# Patient Record
Sex: Female | Born: 1995 | Race: White | Hispanic: No | Marital: Single | Smoking: Never smoker
Health system: Southern US, Community
[De-identification: ages and names within clinical notes are randomized; demographics above are authoritative.]

## PROBLEM LIST (undated history)

## (undated) HISTORY — PX: HAND SURGERY: SHX662

## (undated) HISTORY — PX: WISDOM TOOTH EXTRACTION: SHX21

---

## 2019-10-19 ENCOUNTER — Encounter: Payer: Self-pay | Admitting: Physician Assistant

## 2019-10-19 ENCOUNTER — Ambulatory Visit: Payer: Self-pay | Admitting: Physician Assistant

## 2019-10-19 ENCOUNTER — Other Ambulatory Visit: Payer: Self-pay

## 2019-10-19 VITALS — BP 118/70 | HR 60 | Temp 98.4°F | Resp 12 | Ht 69.0 in | Wt 136.0 lb

## 2019-10-19 DIAGNOSIS — Z0000 Encounter for general adult medical examination without abnormal findings: Secondary | ICD-10-CM

## 2019-10-19 DIAGNOSIS — Z021 Encounter for pre-employment examination: Secondary | ICD-10-CM

## 2019-10-19 DIAGNOSIS — R82998 Other abnormal findings in urine: Secondary | ICD-10-CM

## 2019-10-19 LAB — POCT URINALYSIS DIPSTICK
Bilirubin, UA: NEGATIVE
Blood, UA: NEGATIVE
Glucose, UA: NEGATIVE
Ketones, UA: NEGATIVE
Nitrite, UA: NEGATIVE
Protein, UA: NEGATIVE
Spec Grav, UA: >=1.03 — AB (ref 1.010–1.025)
Urobilinogen, UA: 0.2 E.U./dL
pH, UA: 6 (ref 5.0–8.0)

## 2019-10-19 NOTE — Progress Notes (Signed)
   Subjective: Employment physical    Patient ID: Catherine Sandoval, female    DOB: 1996-01-14, 24 y.o.   MRN: [id number]  HPI Patient presents for employment physical for [employer].  Patient voices no concerns or complaints.   Review of Systems Negative    Objective:   Physical Exam No acute distress.  HEENT is unremarkable.  Neck is supple for adenopathy or bruits.  Lungs are clear to auscultation.  Heart regular rate and rhythm.  Abdomen with negative HSM, normoactive bowel sounds, soft nontender palpation.  No obvious deformity to the upper or lower extremities.  Patient is full equal range of motion upper and lower extremities.  No obvious deformities of the cervical lumbar spine.  Patient has full equal range of motion cervical lumbar spine.  Cranial nerves II through XII grossly intact.       Assessment & Plan: Well exam.  Urine showed 2+ leukocytes but patient is asymptomatic.  Culture is pending.

## 2019-10-19 NOTE — Progress Notes (Signed)
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 Specimen collected using [facility] Chain of Custody form for [facility] account number [id number]. Specimen ID [id number]  Denies S/Sx of UTI  AMD

## 2019-10-20 LAB — CMP12+LP+TP+TSH+6AC+CBC/D/PLT
ALT: 18 IU/L (ref 0–32)
AST: 26 IU/L (ref 0–40)
Albumin/Globulin Ratio: 2.4 — ABNORMAL HIGH (ref 1.2–2.2)
Albumin: 4.8 g/dL (ref 3.9–5.0)
Alkaline Phosphatase: 55 IU/L (ref 39–117)
BUN/Creatinine Ratio: 10 (ref 9–23)
BUN: 10 mg/dL (ref 6–20)
Basophils Absolute: 0 10*3/uL (ref 0.0–0.2)
Basos: 1 %
Bilirubin Total: 0.3 mg/dL (ref 0.0–1.2)
Calcium: 9.8 mg/dL (ref 8.7–10.2)
Chloride: 106 mmol/L (ref 96–106)
Chol/HDL Ratio: 2.9 ratio (ref 0.0–4.4)
Cholesterol, Total: 211 mg/dL — ABNORMAL HIGH (ref 100–199)
Creatinine, Ser: 1.01 mg/dL — ABNORMAL HIGH (ref 0.57–1.00)
EOS (ABSOLUTE): 0.1 10*3/uL (ref 0.0–0.4)
Eos: 2 %
Estimated CHD Risk: <0.5 times avg. (ref 0.0–1.0)
Free Thyroxine Index: 1.4 (ref 1.2–4.9)
GFR calc Af Amer: 89 mL/min/{1.73_m2} (ref >59)
GFR calc non Af Amer: 77 mL/min/{1.73_m2} (ref >59)
GGT: 12 IU/L (ref 0–60)
Globulin, Total: 2 g/dL (ref 1.5–4.5)
Glucose: 87 mg/dL (ref 65–99)
HDL: 73 mg/dL (ref >39)
Hematocrit: 44.2 % (ref 34.0–46.6)
Hemoglobin: 14.8 g/dL (ref 11.1–15.9)
Immature Grans (Abs): 0 10*3/uL (ref 0.0–0.1)
Immature Granulocytes: 0 %
Iron: 127 ug/dL (ref 27–159)
LDH: 195 IU/L (ref 119–226)
LDL Chol Calc (NIH): 113 mg/dL — ABNORMAL HIGH (ref 0–99)
Lymphocytes Absolute: 2.6 10*3/uL (ref 0.7–3.1)
Lymphs: 45 %
MCH: 31.6 pg (ref 26.6–33.0)
MCHC: 33.5 g/dL (ref 31.5–35.7)
MCV: 94 fL (ref 79–97)
Monocytes Absolute: 0.5 10*3/uL (ref 0.1–0.9)
Monocytes: 10 %
Neutrophils Absolute: 2.4 10*3/uL (ref 1.4–7.0)
Neutrophils: 42 %
Phosphorus: 3.2 mg/dL (ref 3.0–4.3)
Platelets: 362 10*3/uL (ref 150–450)
Potassium: 4.8 mmol/L (ref 3.5–5.2)
RBC: 4.68 x10E6/uL (ref 3.77–5.28)
RDW: 11.8 % (ref 11.7–15.4)
Sodium: 144 mmol/L (ref 134–144)
T3 Uptake Ratio: 27 % (ref 24–39)
T4, Total: 5.1 ug/dL (ref 4.5–12.0)
TSH: 0.854 u[IU]/mL (ref 0.450–4.500)
Total Protein: 6.8 g/dL (ref 6.0–8.5)
Triglycerides: 147 mg/dL (ref 0–149)
Uric Acid: 5.2 mg/dL (ref 2.6–6.2)
VLDL Cholesterol Cal: 25 mg/dL (ref 5–40)
WBC: 5.7 10*3/uL (ref 3.4–10.8)

## 2019-10-20 LAB — HEPATITIS B SURFACE ANTIBODY,QUALITATIVE: Hep B Surface Ab, Qual: REACTIVE

## 2019-10-23 ENCOUNTER — Telehealth: Payer: Self-pay

## 2019-10-23 NOTE — Telephone Encounter (Signed)
Lab panel & Hep B titer results available today.  No results for urine culture.  Contacted [facility] Customer Service at [phone] to follow-up on urine culture that was collected on 11/18/19.  [facility] failed to culture the urine specimen & the CSR checked with the department that said the specimen is now too old to run a culture on it. [facility] said we have to recollect & send a new specimen. Ms Hosmer is not in [place] at this time.  She's in [place].  AMD

## 2019-10-28 LAB — URINE CULTURE

## 2019-10-28 LAB — SPECIMEN STATUS REPORT

## 2020-03-06 ENCOUNTER — Other Ambulatory Visit: Payer: 59

## 2020-03-06 DIAGNOSIS — Z1152 Encounter for screening for COVID-19: Secondary | ICD-10-CM

## 2020-03-06 NOTE — Progress Notes (Signed)
Pt presents today with Covid symptoms, no taste or smell, chest pains with cough, fatigue and fever. and declines body aches or chills. CL,RMA

## 2020-03-08 LAB — SARS-COV-2, NAA 2 DAY TAT

## 2020-03-08 LAB — NOVEL CORONAVIRUS, NAA: SARS-CoV-2, NAA: NOT DETECTED

## 2021-03-29 ENCOUNTER — Emergency Department
Admission: EM | Admit: 2021-03-29 | Discharge: 2021-03-29 | Disposition: A | Discharge status: Home / Self Care | Payer: BC Managed Care – PPO | Attending: Emergency Medicine | Admitting: Emergency Medicine

## 2021-03-29 ENCOUNTER — Encounter: Payer: Self-pay | Admitting: Emergency Medicine

## 2021-03-29 ENCOUNTER — Emergency Department: Payer: BC Managed Care – PPO

## 2021-03-29 ENCOUNTER — Other Ambulatory Visit: Payer: Self-pay

## 2021-03-29 DIAGNOSIS — R202 Paresthesia of skin: Secondary | ICD-10-CM | POA: Diagnosis not present

## 2021-03-29 DIAGNOSIS — R059 Cough, unspecified: Secondary | ICD-10-CM | POA: Diagnosis not present

## 2021-03-29 DIAGNOSIS — R0981 Nasal congestion: Secondary | ICD-10-CM | POA: Diagnosis not present

## 2021-03-29 DIAGNOSIS — H53411 Scotoma involving central area, right eye: Secondary | ICD-10-CM | POA: Diagnosis not present

## 2021-03-29 DIAGNOSIS — H538 Other visual disturbances: Secondary | ICD-10-CM | POA: Diagnosis present

## 2021-03-29 LAB — HCG, QUANTITATIVE, PREGNANCY: hCG, Beta Chain, Quant, S: 1 m[IU]/mL (ref <5)

## 2021-03-29 LAB — BASIC METABOLIC PANEL
Anion gap: 10 (ref 5–15)
BUN: 14 mg/dL (ref 6–20)
CO2: 28 mmol/L (ref 22–32)
Calcium: 9.9 mg/dL (ref 8.9–10.3)
Chloride: 101 mmol/L (ref 98–111)
Creatinine, Ser: 0.88 mg/dL (ref 0.44–1.00)
GFR, Estimated: >60 mL/min (ref >60)
Glucose, Bld: 98 mg/dL (ref 70–99)
Potassium: 4.4 mmol/L (ref 3.5–5.1)
Sodium: 139 mmol/L (ref 135–145)

## 2021-03-29 LAB — CBC
HCT: 44.2 % (ref 36.0–46.0)
Hemoglobin: 15.3 g/dL — ABNORMAL HIGH (ref 12.0–15.0)
MCH: 32 pg (ref 26.0–34.0)
MCHC: 34.6 g/dL (ref 30.0–36.0)
MCV: 92.5 fL (ref 80.0–100.0)
Platelets: 355 10*3/uL (ref 150–400)
RBC: 4.78 MIL/uL (ref 3.87–5.11)
RDW: 11.7 % (ref 11.5–15.5)
WBC: 8.1 10*3/uL (ref 4.0–10.5)
nRBC: 0 % (ref 0.0–0.2)

## 2021-03-29 IMAGING — MR MR HEAD WO/W CM
15 series · 48 of 48 positions shown · IV contrast (gadavist)
Comparison: None.

CLINICAL DATA: Bilateral hand tingling and right eye vision changes

EXAM:
MRI HEAD AND ORBITS WITHOUT AND WITH CONTRAST
TECHNIQUE: Multiplanar, multiecho pulse sequences of the brain and surrounding
structures were obtained without and with intravenous contrast.
Multiplanar, multiecho pulse sequences of the orbits and surrounding
structures were obtained including fat saturation techniques, before
and after intravenous contrast administration.
CONTRAST:  7mL GADAVIST GADOBUTROL 1 MMOL/ML IV SOLN

[Series 5: ax dwi_tracew · axial · 3.0mm · 0.65mm/px · z∈[-120,+9]mm · 2 of 45 slices shown]
[im 1/45]
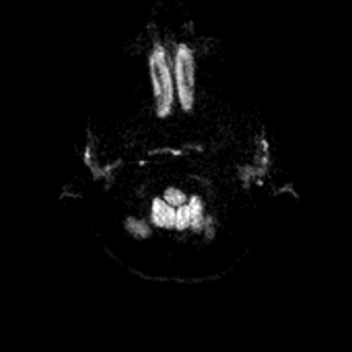
[im 45/45]
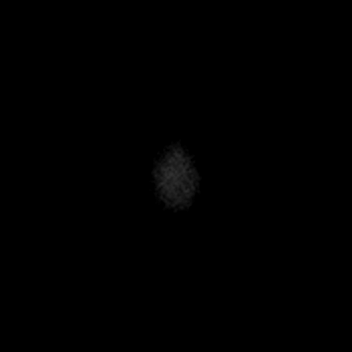

[Series 6: ax dwi_adc · axial · 3.0mm · 0.65mm/px · z∈[-129,+9]mm · 3 of 48 slices shown]
[im 1/48]
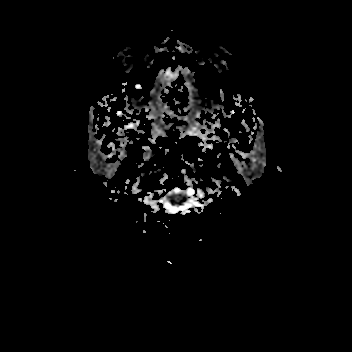
[im 24/48]
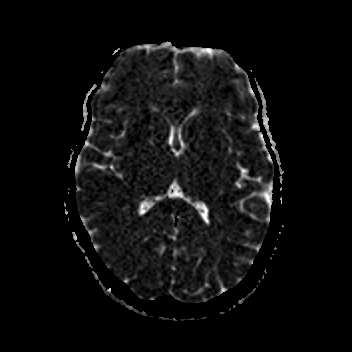
[im 48/48]
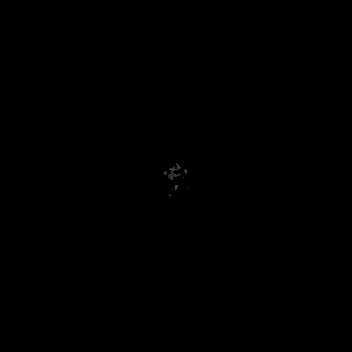

[Series 7: cor dwi_tracew · coronal · 5.0mm · 0.68mm/px · 2 of 40 slices shown]
[im 1/40]
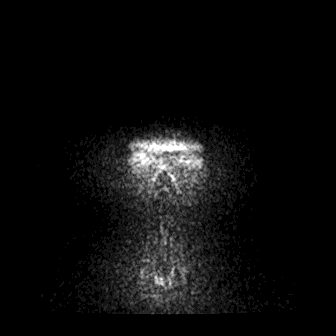
[im 40/40]
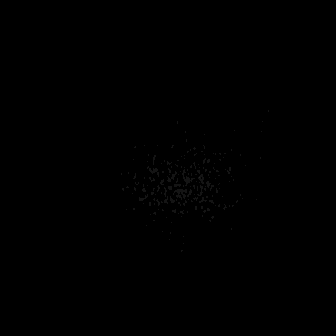

[Series 8: cor dwi_adc · coronal · 5.0mm · 0.68mm/px · 2 of 39 slices shown]
[im 1/39]
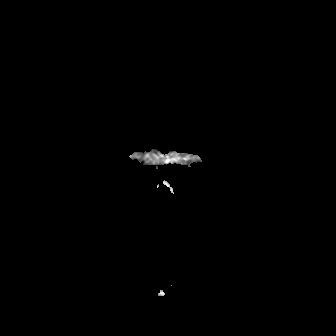
[im 39/39]
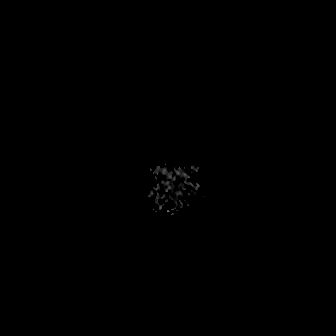

[Series 9: T1 · sagittal · 5.0mm · 0.62mm/px · 1 of 25 slices shown (1 of 3)]
[im 1/25]
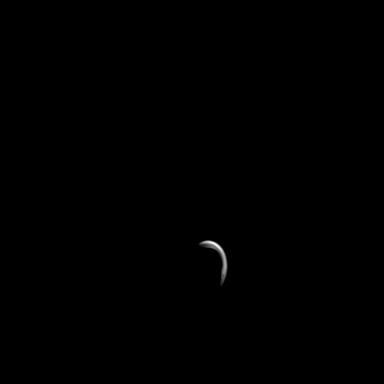

[Series 10: T2 · axial · 5.0mm · 0.53mm/px · 1 of 26 slices shown (1 of 2)]
[im 1/26]
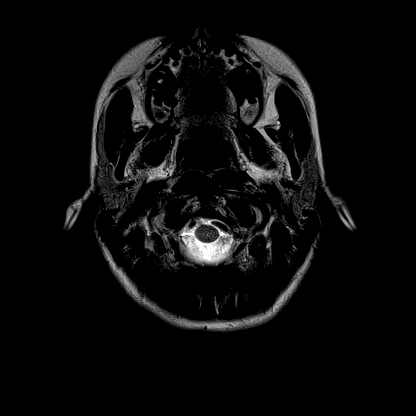

[Series 11: mag_images · axial · 3.0mm · 0.90mm/px · z∈[-138,+19]mm · 3 of 60 slices shown]
[im 1/60]
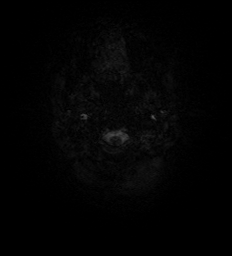
[im 30/60]
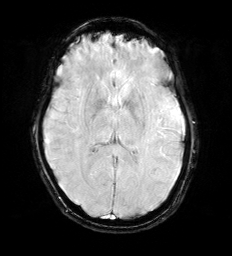
[im 60/60]
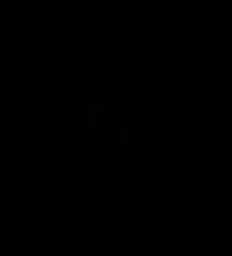

[Series 12: pha_images · axial · 3.0mm · 0.90mm/px · z∈[-138,+13]mm · 3 of 56 slices shown]
[im 1/56]
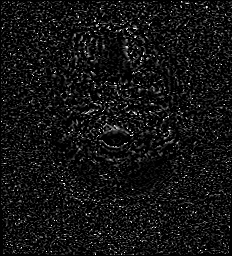
[im 28/56]
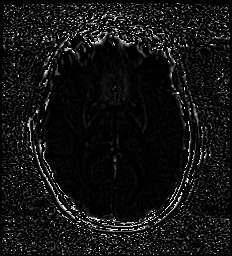
[im 56/56]
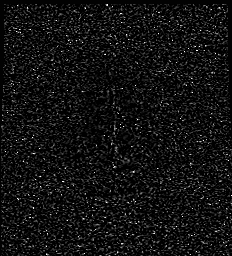

[Series 13: swi_images · axial · 3.0mm · 0.90mm/px · z∈[-138,+19]mm · 3 of 60 slices shown]
[im 1/60]
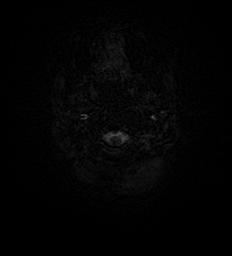
[im 30/60]
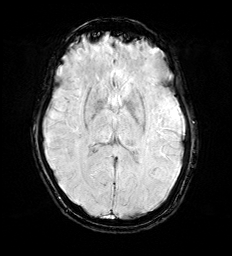
[im 60/60]
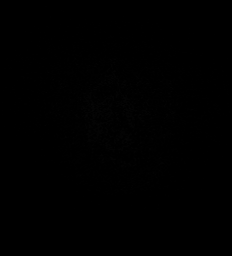

[Series 14: mip_images(sw) · axial · 24.0mm · 0.90mm/px · z∈[-129,+9]mm · 3 of 53 slices shown]
[im 1/53]
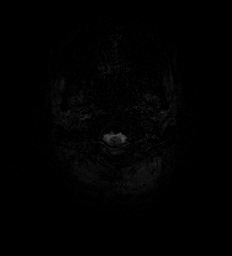
[im 27/53]
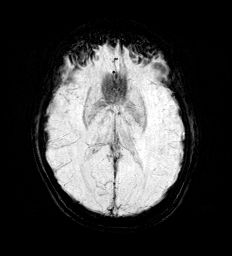
[im 53/53]
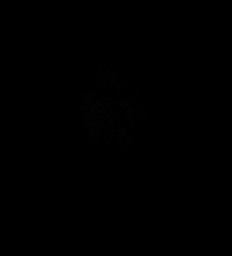

[Series 15: FLAIR · axial · 3.0mm · 0.53mm/px · z∈[-129,+14]mm · 3 of 55 slices shown]
[im 1/55]
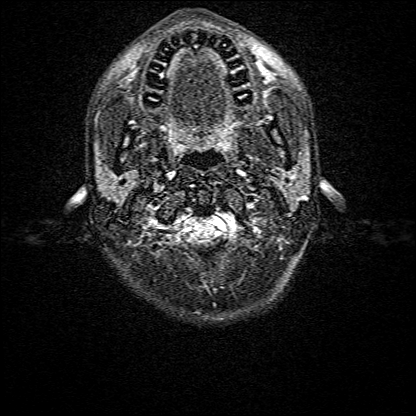
[im 28/55]
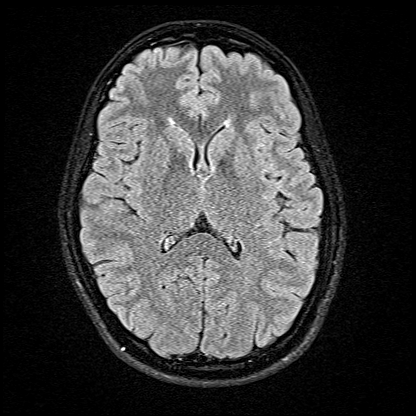
[im 55/55]
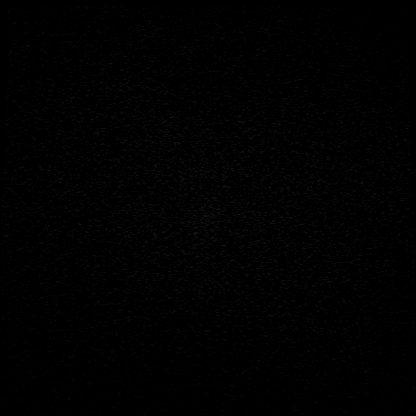

[Series 16: T1 · axial · 1.0mm · 0.98mm/px · z∈[-135,+6]mm · 9 of 159 slices shown (2 of 3)]
[im 1/159]
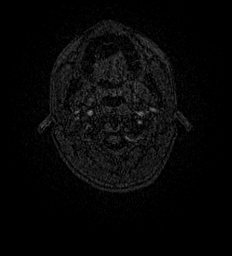
[im 20/159]
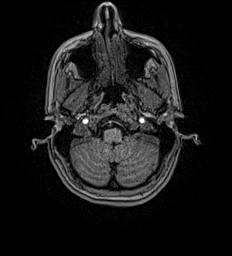
[im 40/159]
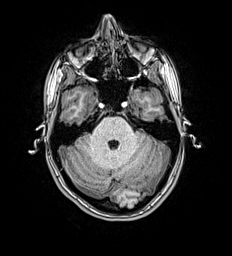
[im 60/159]
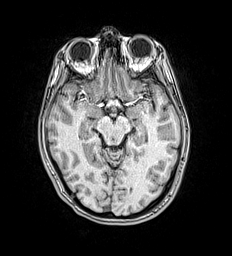
[im 80/159]
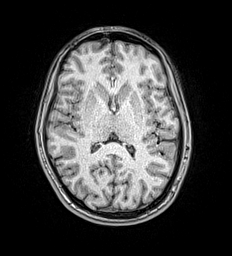
[im 99/159]
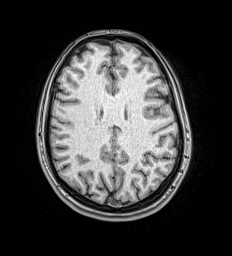
[im 119/159]
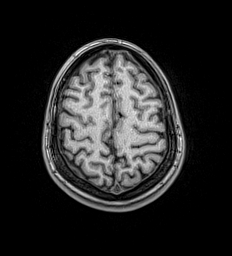
[im 139/159]
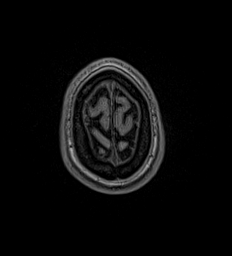
[im 159/159]
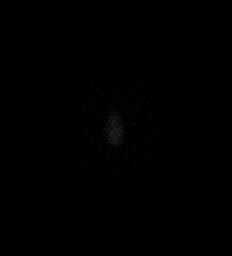

[Series 31: T2 · coronal · 5.0mm · 0.45mm/px · 2 of 31 slices shown (2 of 2)]
[im 1/31]
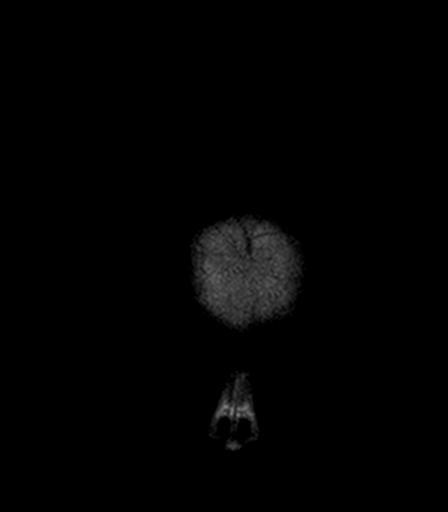
[im 31/31]
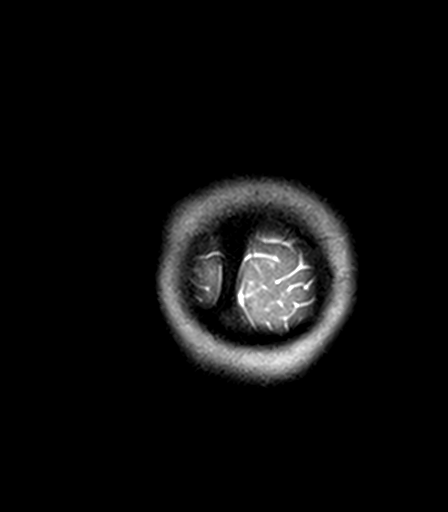

[Series 32: T1 · axial · 1.0mm · 0.98mm/px · z∈[-129,+16]mm · 9 of 160 slices shown (3 of 3)]
[im 1/160]
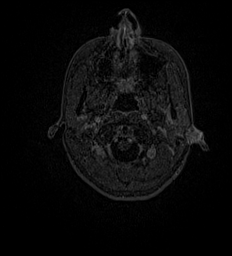
[im 20/160]
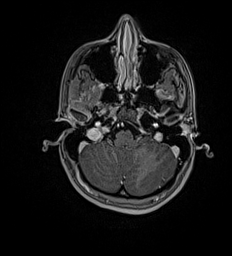
[im 40/160]
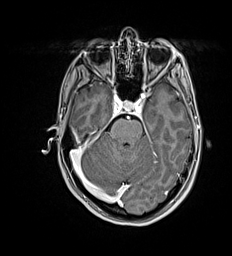
[im 60/160]
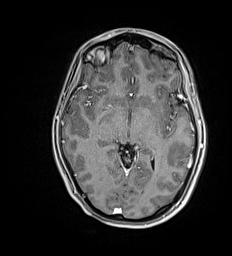
[im 80/160]
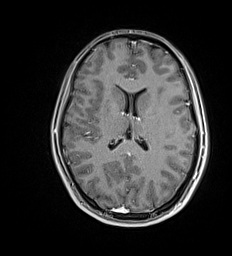
[im 100/160]
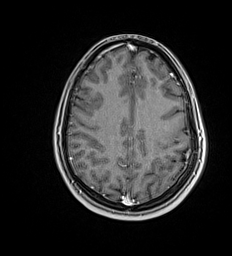
[im 120/160]
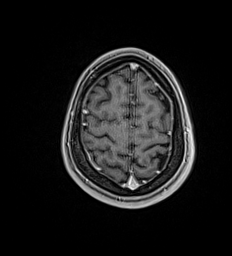
[im 140/160]
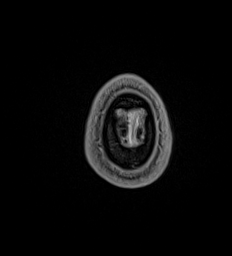
[im 160/160]
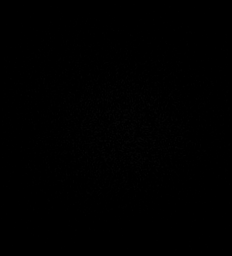

[Series 33: T1 post-contrast · coronal · 5.0mm · 0.90mm/px · 2 of 31 slices shown]
[im 1/31]
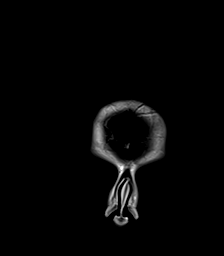
[im 31/31]
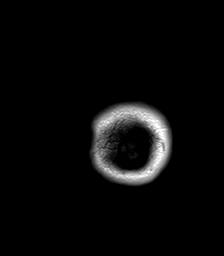

[48 of 48 positions shown; findings below may reference images not displayed]

FINDINGS: MRI HEAD FINDINGS

Brain: No acute infarct, mass effect or extra-axial collection. No
acute or chronic hemorrhage. Normal white matter signal, parenchymal
volume and CSF spaces. The midline structures are normal. There is
no abnormal contrast enhancement.

Vascular: Major flow voids are preserved.

Skull and upper cervical spine: Normal calvarium and skull base.
Visualized upper cervical spine and soft tissues are normal.

Sinuses/Orbits:No paranasal sinus fluid levels or advanced mucosal
thickening. No mastoid or middle ear effusion. Normal orbits.

MRI ORBITS FINDINGS

Orbits:

--Globes: Normal.

--Bony orbit: Normal.

--Preseptal soft tissues: Normal.

--Intra- and extraconal orbital fat: Normal. No inflammatory
stranding.

--Optic nerves: Normal.

--Lacrimal glands and fossae: Normal.

--Extraocular muscles: Normal.

Visualized sinuses:  No fluid levels or advanced mucosal thickening.

Soft tissues: Normal.

Limited intracranial: Normal.
IMPRESSION: Normal MRI of the brain and orbits.

## 2021-03-29 IMAGING — CT CT HEAD W/O CM
3 series · 15 of 47 positions shown, 18 images · non-contrast
Comparison: None.

CLINICAL DATA: Altered mental status

EXAM:
CT HEAD WITHOUT CONTRAST
TECHNIQUE: Contiguous axial images were obtained from the base of the skull
through the vertex without intravenous contrast.

[Series 2: head wo · axial · 0.39mm/px · z∈[-221,-76]mm · 9 of 35 slices shown, 12 images]
[im 3/35  brain]
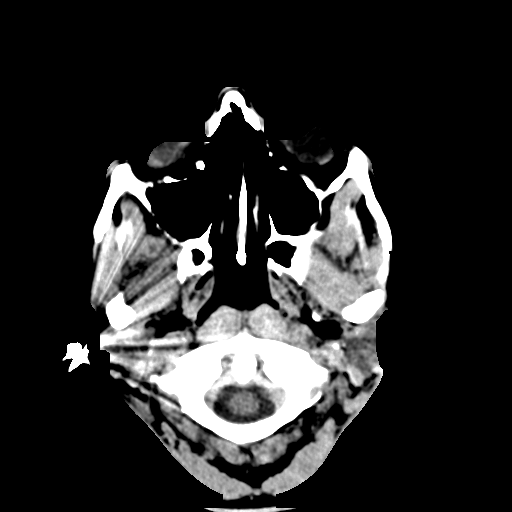
[im 3/35  bone]
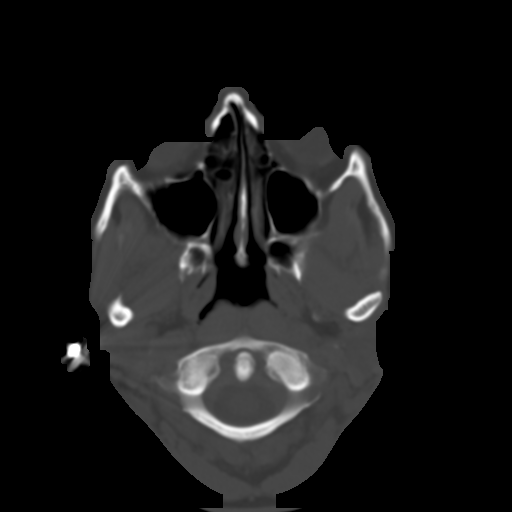
[im 6/35  brain]
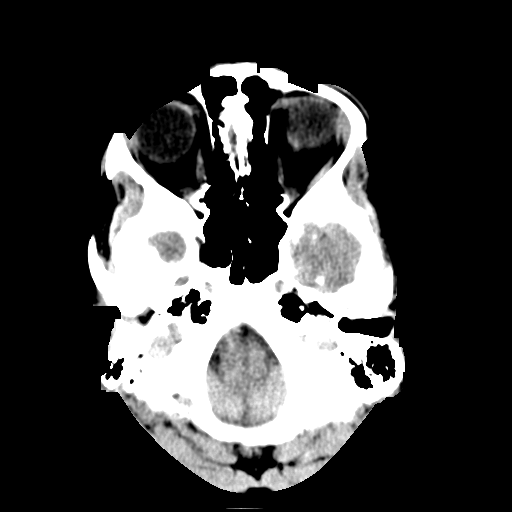
[im 10/35  brain]
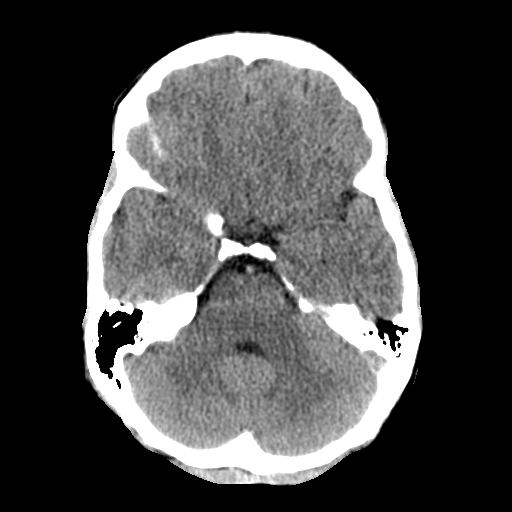
[im 13/35  brain]
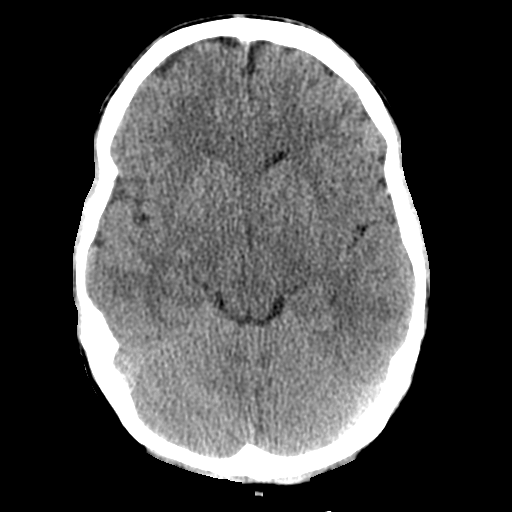
[im 18/35  brain]
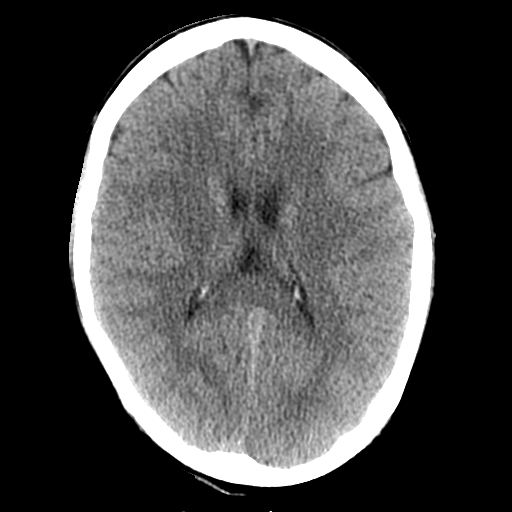
[im 18/35  bone]
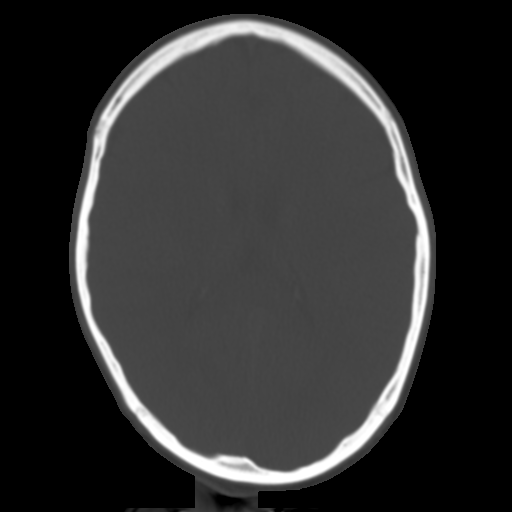
[im 22/35  brain]
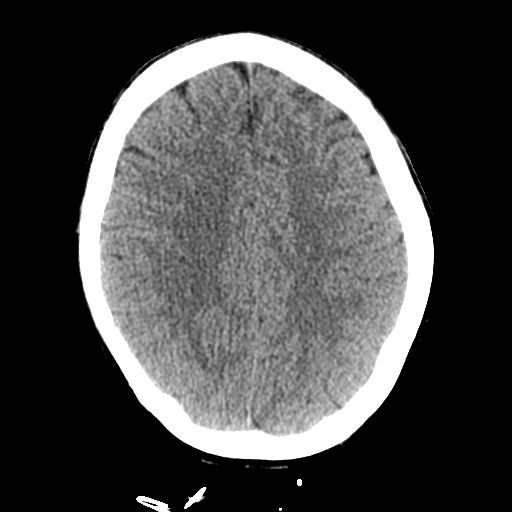
[im 25/35  brain]
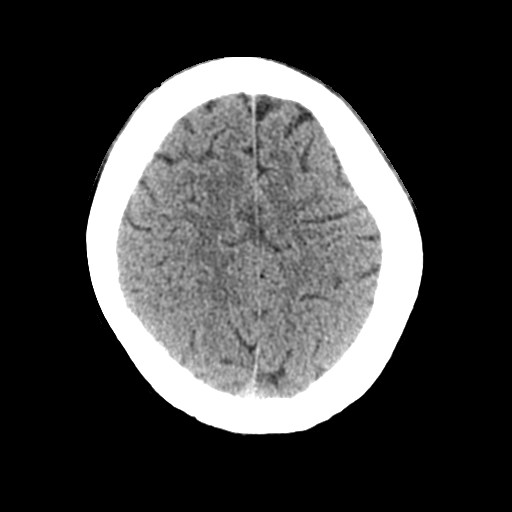
[im 29/35  brain]
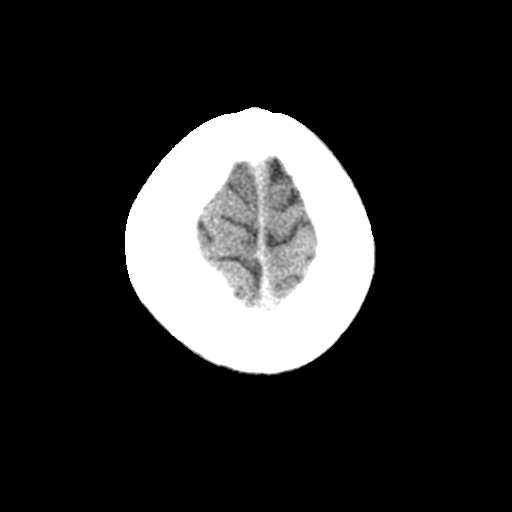
[im 32/35  brain]
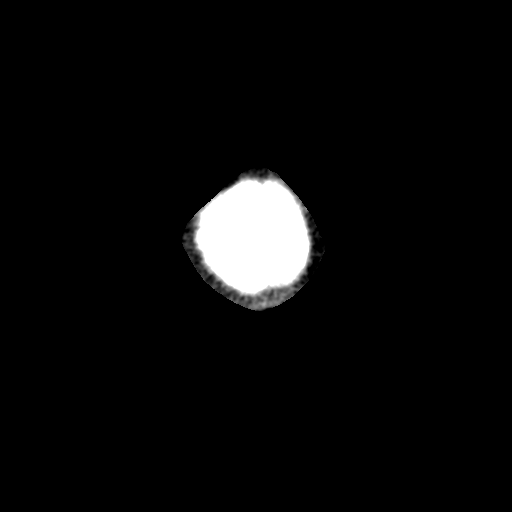
[im 32/35  bone]
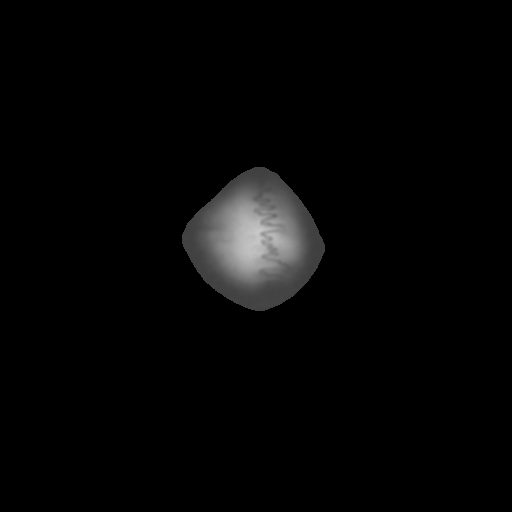

[Series 4: coronal soft tissue · coronal · 0.34mm/px · 3 of 72 slices shown]
[im 24/72  brain]
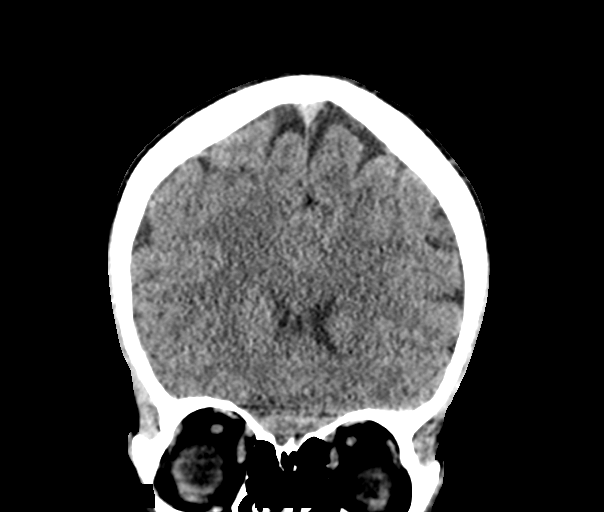
[im 32/72  brain]
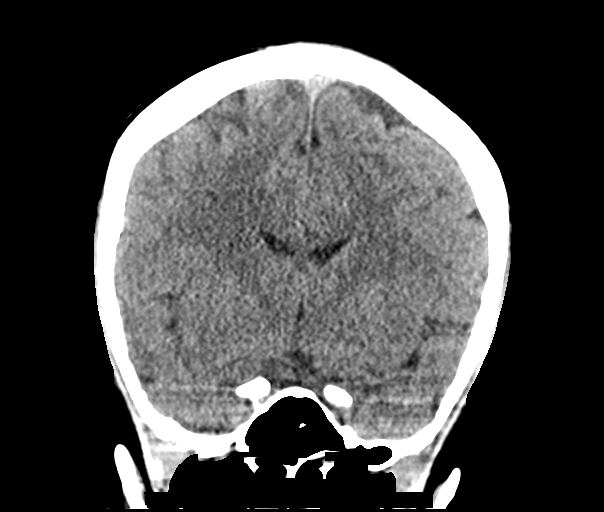
[im 40/72  brain]
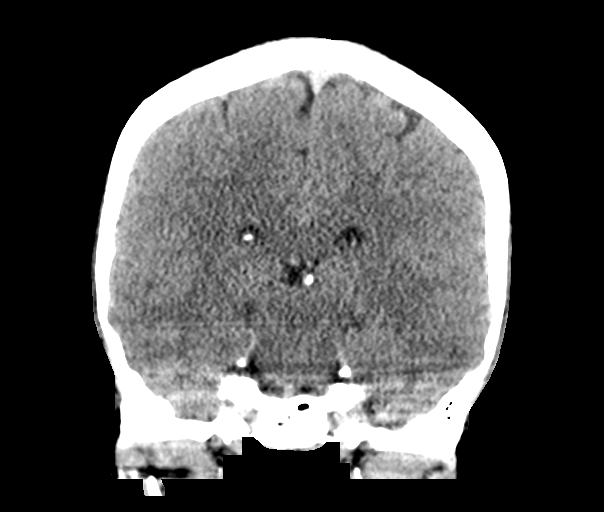

[Series 5: sagittal soft tissue · sagittal · 0.33mm/px · 3 of 77 slices shown]
[im 26/77  brain]
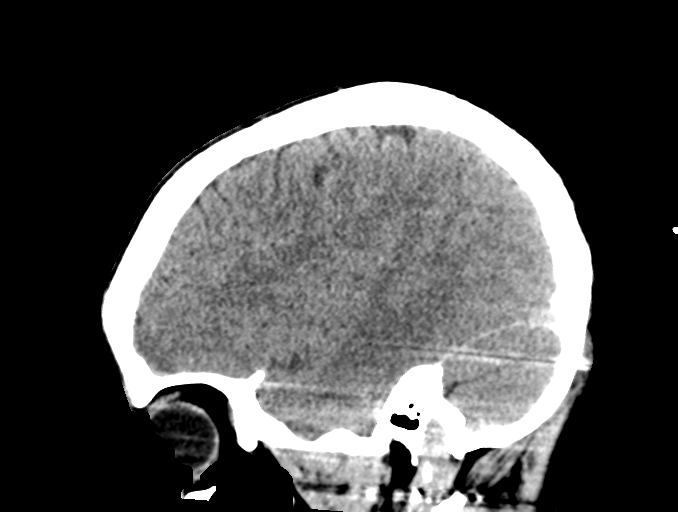
[im 39/77  brain]
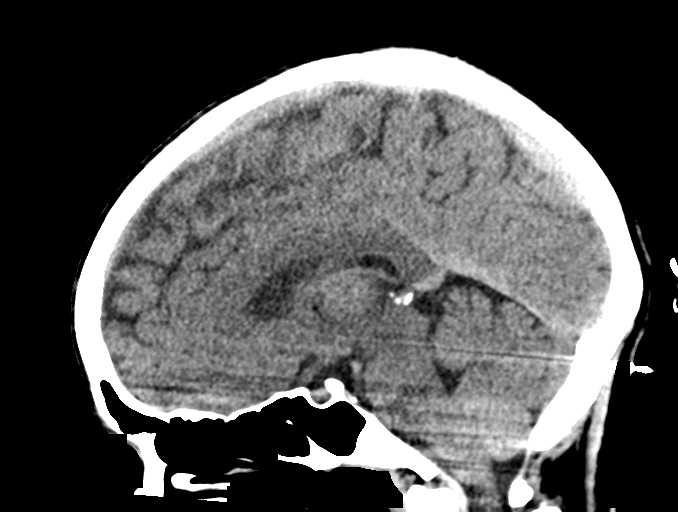
[im 51/77  brain]
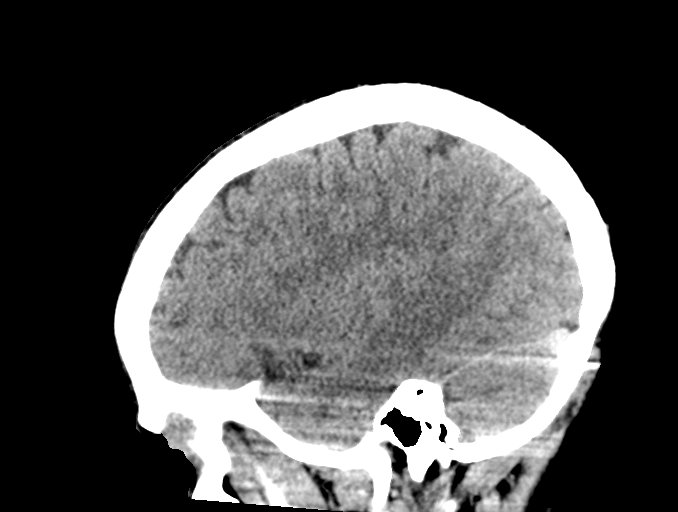

[15 of 47 positions shown; findings below may reference images not displayed]

FINDINGS: Brain: There is no acute intracranial hemorrhage, extra-axial fluid
collection, or acute infarct. The ventricles are normal in size.
There is no mass lesion. There is no midline shift.

Vascular: No hyperdense vessel or unexpected calcification.

Skull: Normal. Negative for fracture or focal lesion.

Sinuses/Orbits: The imaged paranasal sinuses are clear. The globes
and orbits are unremarkable.

Other: None.
IMPRESSION: Normal head CT

## 2021-03-29 IMAGING — MR MR ORBITS WO/W CM
6 of 7 series · 36 of 48 positions shown · IV contrast (7 ml Gadavist)
Comparison: None.

CLINICAL DATA: Bilateral hand tingling and right eye vision changes

EXAM:
MRI HEAD AND ORBITS WITHOUT AND WITH CONTRAST
TECHNIQUE: Multiplanar, multiecho pulse sequences of the brain and surrounding
structures were obtained without and with intravenous contrast.
Multiplanar, multiecho pulse sequences of the orbits and surrounding
structures were obtained including fat saturation techniques, before
and after intravenous contrast administration.
CONTRAST:  7mL GADAVIST GADOBUTROL 1 MMOL/ML IV SOLN

[Series 22: T2 · axial · 3.0mm · 0.78mm/px · z∈[-121,-63]mm · 5 of 17 slices shown (1 of 2)]
[im 1/17]
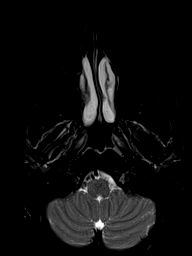
[im 5/17]
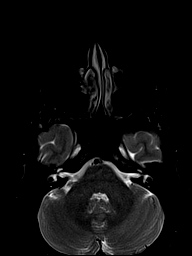
[im 9/17]
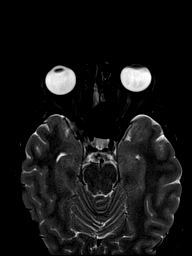
[im 13/17]
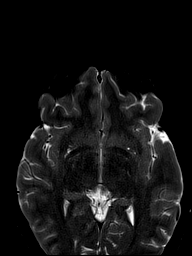
[im 17/17]
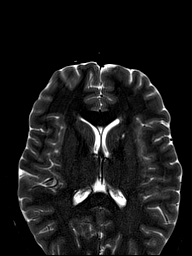

[Series 24: T2 · coronal · 3.0mm · 0.78mm/px · 8 of 35 slices shown (2 of 2)]
[im 1/35]
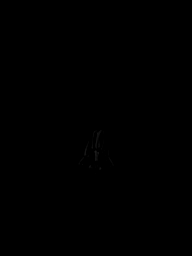
[im 5/35]
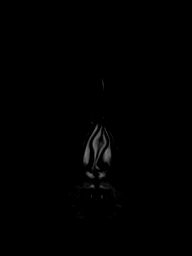
[im 10/35]
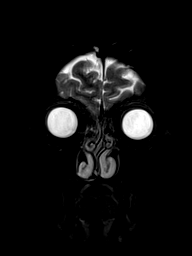
[im 15/35]
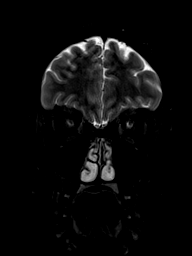
[im 20/35]
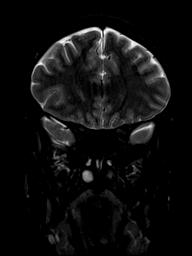
[im 25/35]
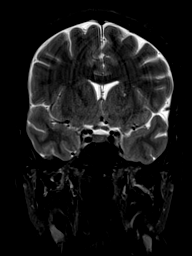
[im 30/35]
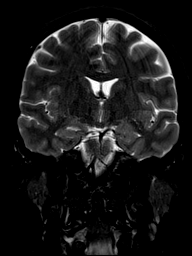
[im 35/35]
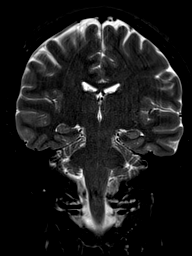

[Series 25: T1 · axial · non-contrast · 3.0mm · 0.31mm/px · z∈[-113,-54]mm · 5 of 23 slices shown (1 of 2)]
[im 1/23]
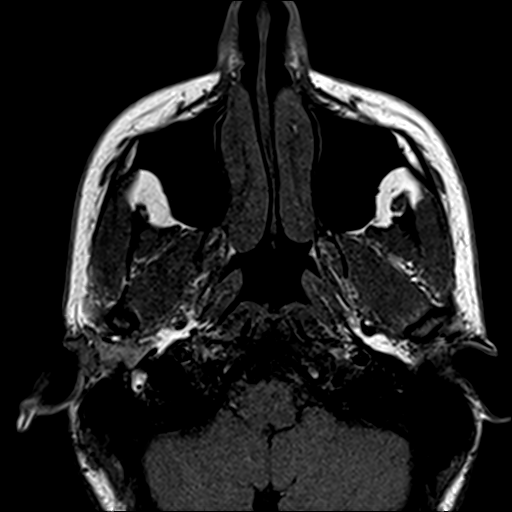
[im 6/23]
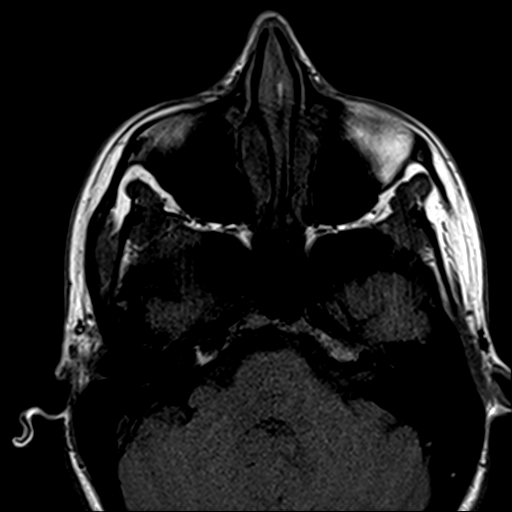
[im 12/23]
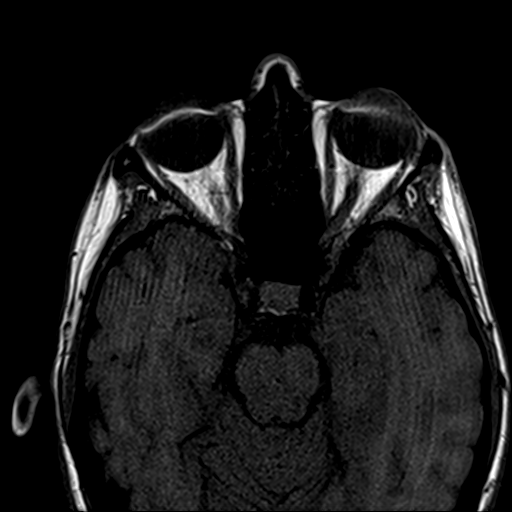
[im 17/23]
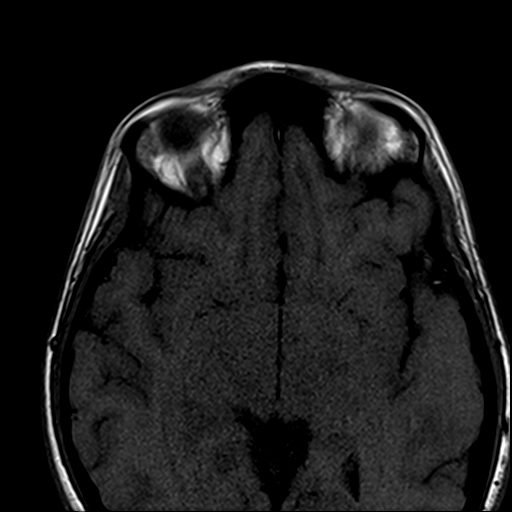
[im 23/23]
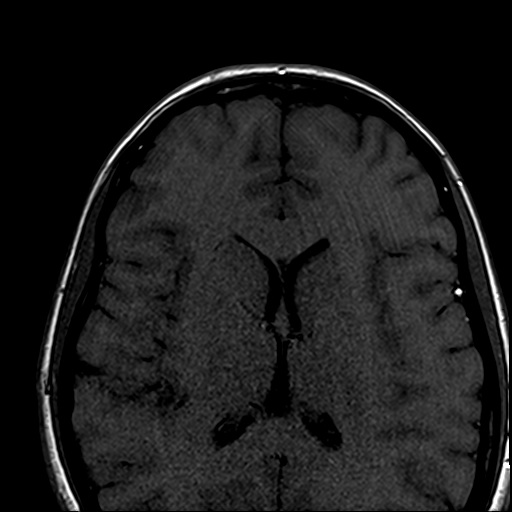

[Series 26: T1 · coronal · non-contrast · 3.0mm · 0.35mm/px · 8 of 40 slices shown (2 of 2)]
[im 1/40]
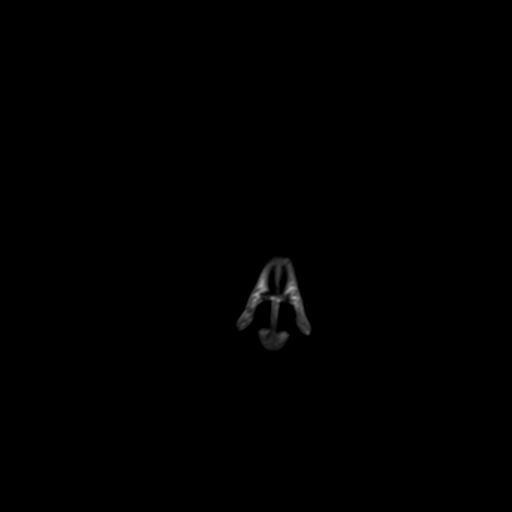
[im 5/40]
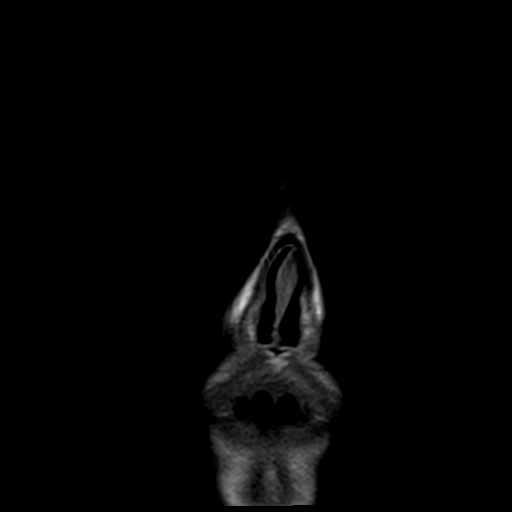
[im 14/40]
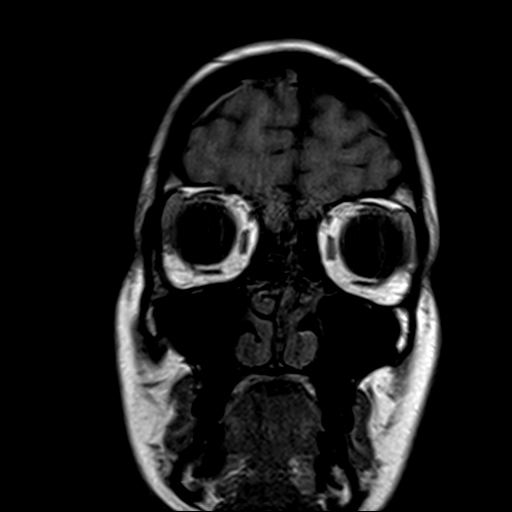
[im 18/40]
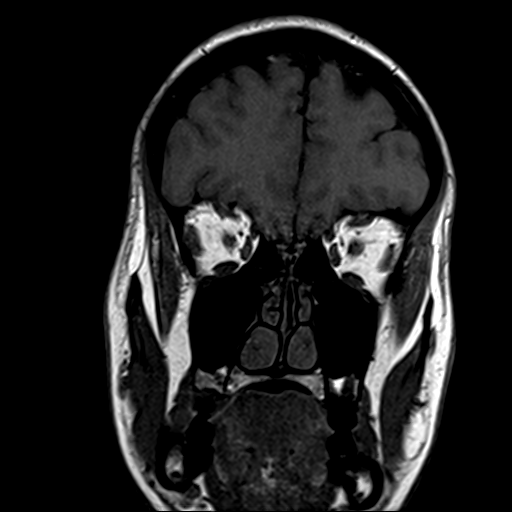
[im 22/40]
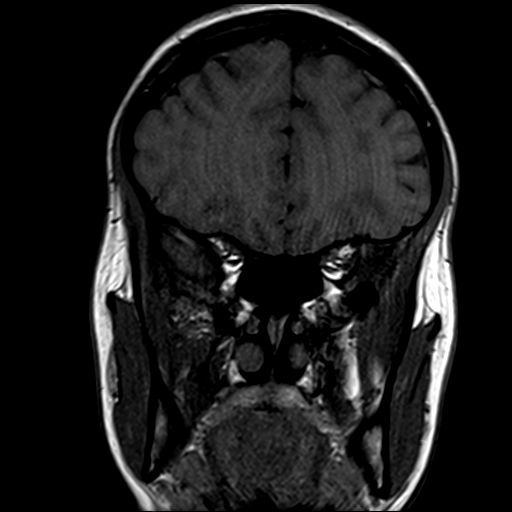
[im 27/40]
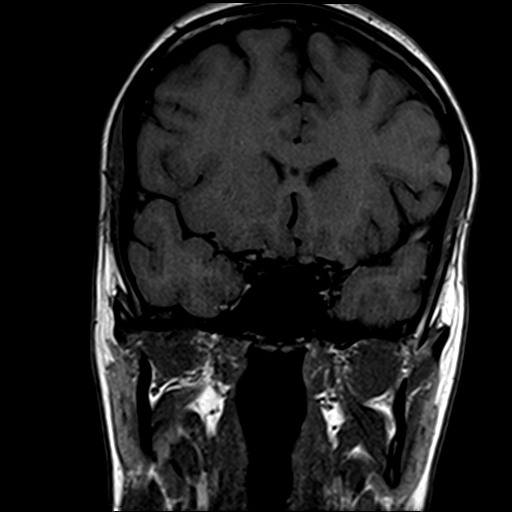
[im 35/40]
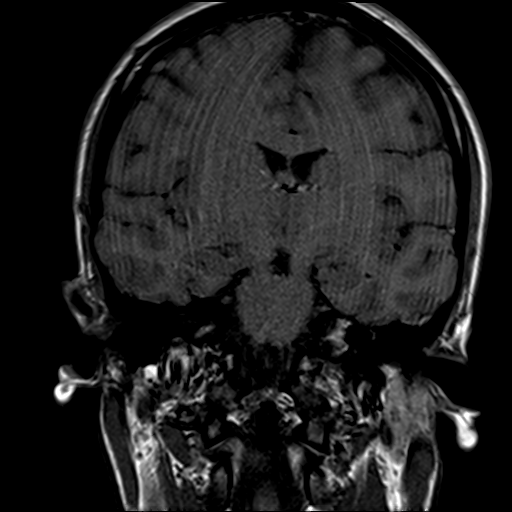
[im 40/40]
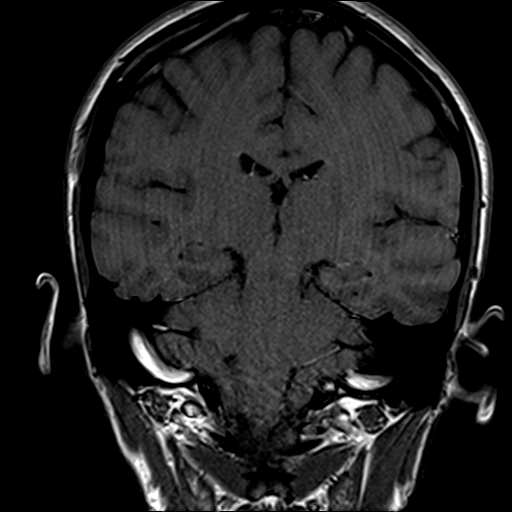

[Series 28: T1 fat-sat · coronal · 3.0mm · 0.70mm/px · 8 of 40 slices shown (1 of 2)]
[im 1/40]
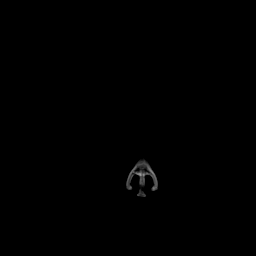
[im 5/40]
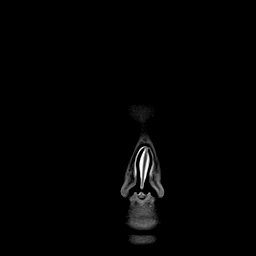
[im 14/40]
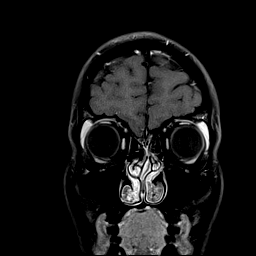
[im 18/40]
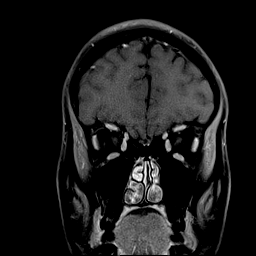
[im 22/40]
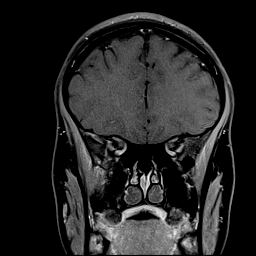
[im 27/40]
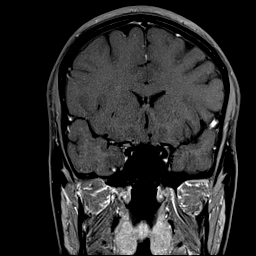
[im 35/40]
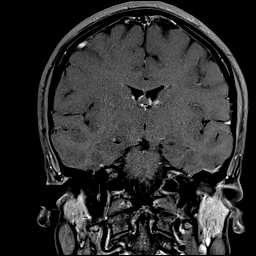
[im 40/40]
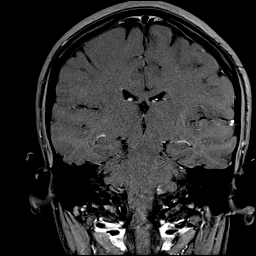

[Series 29: T1 fat-sat · axial · 3.0mm · 0.62mm/px · z∈[-113,-100]mm · 2 of 23 slices shown (2 of 2)]
[im 1/23]
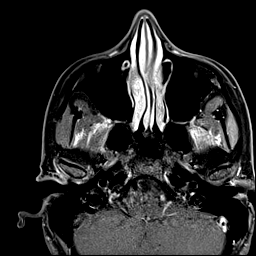
[im 6/23]
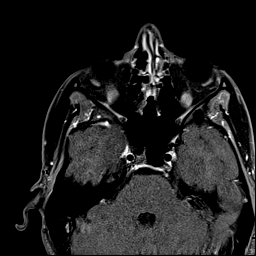

[36 of 48 positions shown; findings below may reference images not displayed]

FINDINGS: MRI HEAD FINDINGS

Brain: No acute infarct, mass effect or extra-axial collection. No
acute or chronic hemorrhage. Normal white matter signal, parenchymal
volume and CSF spaces. The midline structures are normal. There is
no abnormal contrast enhancement.

Vascular: Major flow voids are preserved.

Skull and upper cervical spine: Normal calvarium and skull base.
Visualized upper cervical spine and soft tissues are normal.

Sinuses/Orbits:No paranasal sinus fluid levels or advanced mucosal
thickening. No mastoid or middle ear effusion. Normal orbits.

MRI ORBITS FINDINGS

Orbits:

--Globes: Normal.

--Bony orbit: Normal.

--Preseptal soft tissues: Normal.

--Intra- and extraconal orbital fat: Normal. No inflammatory
stranding.

--Optic nerves: Normal.

--Lacrimal glands and fossae: Normal.

--Extraocular muscles: Normal.

Visualized sinuses:  No fluid levels or advanced mucosal thickening.

Soft tissues: Normal.

Limited intracranial: Normal.
IMPRESSION: Normal MRI of the brain and orbits.

## 2021-03-29 MED ORDER — GADOBUTROL 1 MMOL/ML IV SOLN
7.0000 mL | Freq: Once | INTRAVENOUS | Status: AC | PRN
  Administered 2021-03-29: 7 mL via INTRAVENOUS
  Filled 2021-03-29: qty 7.5

## 2021-03-29 NOTE — Discharge Instructions (Addendum)
Follow up with Dr. Druscilla Brownie in the clinic on Monday.   Return to the ED with any worsening vision changes, strokelike symptoms, fevers or passing out with her symptoms.

## 2021-03-29 NOTE — ED Provider Notes (Signed)
Emergency Medicine Provider Triage Evaluation Note  Catherine Sandoval , a 25 y.o. female  was evaluated in triage.  Pt complains of visual disturbance. She notes a "black triangle" in her right eye visual field at about the 2 o'clock position. She denies any preceding head injury or eye trauma. She notes onset this morning, without associated nausea, vomiting, tinnitus, or headache. She also notes some bilateral hand tingling that she has had before, started spontaneously yesterday.   Review of Systems  Positive: Visual disturbance Negative: CP, headache, N/V, dizziness  Physical Exam  BP 130/80 (BP Location: Left Arm)   Pulse 67   Temp 98.3 F (36.8 C) (Oral)   Resp 18   Ht 5\' 9"  (1.753 m)   Wt 66.7 kg   LMP 03/15/2021   SpO2 99%   BMI 21.71 kg/m  Gen:   Awake, no distress  NAD Resp:  Normal effort CTA MSK:   Moves extremities without difficulty  Other:  Neuro: normal gross sensation. [place] II-XII grossly intact  Medical Decision Making  Medically screening exam initiated at 2:46 PM.  Appropriate orders placed.  Catherine Sandoval was informed that the remainder of the evaluation will be completed by another provider, this initial triage assessment does not replace that evaluation, and the importance of remaining in the ED until their evaluation is complete.  Patient with ED evaluation of visual disturbance and bilateral UE paresthesias.    Sebastian Ache, PA-C 03/29/21 1639    03/31/21, MD 03/30/21 1549

## 2021-03-29 NOTE — ED Provider Notes (Signed)
  Patient received in signout from Dr. Antoine Primas pending MRIs to evaluate for stigmata of MS versus stroke versus other intracranial pathology causing visual field cut.  I reviewed these MRIs which are reassuring without evidence of these pathologies.  We will discharge the patient and have her follow-up with ophthalmology in the clinic.  I discussed return precautions with the patient.   Delton Prairie, MD 03/29/21 2038

## 2021-03-29 NOTE — ED Triage Notes (Signed)
Pt via POV from home. Pt c/o a "black triangle" in her field of vision in the R eye that started yesterday and states that she had bilateral hand tingling that has been intermittent for the past 2 weeks. Denies pain. Pt is A&Ox4 and NAD.

## 2021-03-29 NOTE — ED Provider Notes (Signed)
[facility] Emergency Department Provider Note  ____________________________________________   Event Date/Time   First MD Initiated Contact with Patient 03/29/21 1709     (approximate)  I have reviewed the triage vital signs and the nursing notes.   HISTORY  Chief Complaint Blurred Vision   HPI Catherine Sandoval is a 25 y.o. female without significant past medical history who presents for assessment of several symptoms most concerning to her is the development of a black triangle in her right visual field that she noticed yesterday.  Patient states she has had 2 or 3 weeks of some tingling in her bilateral upper extremities as well as some cough and congestion and has had several COVID test that have been negative but has never had any problems with her vision before.  She has a tingling is not as bad as it has been over the last couple weeks today.  She denies any eye pain, discharge, blurry vision, double vision or any change in her visual field on the left.  She denies any headache, earache, fevers, neck pain, chest pain, shortness of breath, abdominal pain, nausea, vomiting, diarrhea, dysuria, rash or focal weakness or numbness.  Denies any recent head trauma.  Does not wear glasses or use contacts.  No foreign body sensation         History reviewed. No pertinent past medical history.  There are no problems to display for this patient.   Past Surgical History:  Procedure Laterality Date   HAND SURGERY Left    WISDOM TOOTH EXTRACTION      Prior to Admission medications   Not on File    Allergies Patient has no known allergies.  History reviewed. No pertinent family history.  Social History Social History   Tobacco Use   Smoking status: Never   Smokeless tobacco: Never    Review of Systems  Review of Systems  Constitutional:  Negative for chills and fever.  HENT:  Positive for congestion. Negative for sore throat.   Eyes:  Negative for  blurred vision, double vision, photophobia, pain, discharge and redness.  Respiratory:  Negative for cough and stridor.   Cardiovascular:  Negative for chest pain.  Gastrointestinal:  Negative for vomiting.  Genitourinary:  Negative for dysuria.  Musculoskeletal:  Negative for myalgias.  Skin:  Negative for rash.  Neurological:  Positive for dizziness (1 episode several days ago not today) and tingling (on and off over last several weeks). Negative for seizures, loss of consciousness and headaches.  Psychiatric/Behavioral:  Negative for suicidal ideas.   All other systems reviewed and are negative.    ____________________________________________   PHYSICAL EXAM:  VITAL SIGNS: ED Triage Vitals  Enc Vitals Group     BP 03/29/21 1421 130/80     Pulse Rate 03/29/21 1421 67     Resp 03/29/21 1421 18     Temp 03/29/21 1421 98.3 F (36.8 C)     Temp Source 03/29/21 1421 Oral     SpO2 03/29/21 1421 99 %     Weight 03/29/21 1421 147 lb (66.7 kg)     Height 03/29/21 1421 5\' 9"  (1.753 m)     Head Circumference --      Peak Flow --      Pain Score 03/29/21 1436 0     Pain Loc --      Pain Edu? --      Excl. in GC? --    Vitals:   03/29/21 1421  BP: 130/80  Pulse: 67  Resp: 18  Temp: 98.3 F (36.8 C)  SpO2: 99%   Physical Exam Vitals and nursing note reviewed.  Constitutional:      General: She is not in acute distress.    Appearance: She is well-developed.  HENT:     Head: Normocephalic and atraumatic.     Right Ear: External ear normal.     Left Ear: External ear normal.     Nose: Nose normal.     Mouth/Throat:     Mouth: Mucous membranes are moist.  Eyes:     Conjunctiva/sclera: Conjunctivae normal.  Cardiovascular:     Rate and Rhythm: Normal rate and regular rhythm.     Heart sounds: No murmur heard. Pulmonary:     Effort: Pulmonary effort is normal. No respiratory distress.     Breath sounds: Normal breath sounds.  Abdominal:     Palpations: Abdomen is soft.      Tenderness: There is no abdominal tenderness.  Musculoskeletal:     Cervical back: Neck supple.  Skin:    General: Skin is warm and dry.     Capillary Refill: Capillary refill takes less than 2 seconds.  Neurological:     Mental Status: She is alert and oriented to person, place, and time.  Psychiatric:        Mood and Affect: Mood normal.    Cranial nerves II through XII grossly intact.  No pronator drift.  No finger dysmetria.  Symmetric 5/5 strength of all extremities.  Sensation intact to light touch in all extremities.    Visual acuity is 20/10 in the left eye and 20/15 in the right eye.  Patient does seem to have decreased visual acuity in the right lateral visual field.  Sensation is intact to light touch in the bile upper extremities in the distribution of the radial ulnar and median nerves although patient states he feels a little different on the radial nerve distribution in the left hand which is chronic from a very remote surgery and left hand. ____________________________________________   LABS (all labs ordered are listed, but only abnormal results are displayed)  Labs Reviewed  CBC - Abnormal; Notable for the following components:      Result Value   Hemoglobin 15.3 (*)    All other components within normal limits  BASIC METABOLIC PANEL  HCG, QUANTITATIVE, PREGNANCY   ____________________________________________  EKG  ____________________________________________  RADIOLOGY  ED MD interpretation: ED head without contrast shows no evidence of hemorrhage, subacute CVA, mass-effect ventriculomegaly of acute cranial process.  Official radiology report(s): CT HEAD WO CONTRAST ( )  Result Date: 03/29/2021 CLINICAL DATA:  Altered mental status EXAM: CT HEAD WITHOUT CONTRAST TECHNIQUE: Contiguous axial images were obtained from the base of the skull through the vertex without intravenous contrast. COMPARISON:  None. FINDINGS: Brain: There is no acute  intracranial hemorrhage, extra-axial fluid collection, or acute infarct. The ventricles are normal in size. There is no mass lesion. There is no midline shift. Vascular: No hyperdense vessel or unexpected calcification. Skull: Normal. Negative for fracture or focal lesion. Sinuses/Orbits: The imaged paranasal sinuses are clear. The globes and orbits are unremarkable. Other: None. IMPRESSION: Normal head CT Electronically Signed   By: Lesia Hausen M.D.   On: 03/29/2021 15:34    ____________________________________________   PROCEDURES  Procedure(s) performed (including Critical Care):  Procedures   ____________________________________________   INITIAL IMPRESSION / ASSESSMENT AND PLAN / ED COURSE        Patient presents with above-stated  history and exam for assessment of several weeks of some paresthesias in her upper extremities approximately 1 day of black spots in the center right visual field describes triangle.  On arrival she is afebrile hemodynamically stable.  With exception of apparent visual field deficit in the right lateral field and visual acuity of 20/15 in the right compared to 2010 the left there are no focal neurological acute deficits.  Patient states she has some chronic paresthesias in the left forearm from some remote surgery.  She has no associated pain or history of trauma to suggest corneal abrasion or foreign body.  I did perform a bedside ultrasound that showed no clear retinal detachment.  She has no pupil asymmetry or abnormal dilation or pain at this time to suggest elevated intraocular pressures.  Differential includes MS versus other causes of optic neuritis versus very typical presentation for CVA.    CT without contrast is unremarkable.  CBC and BMP are unremarkable.  Pregnancy test is negative   Discussed with on-call ophthalmologist Dr. Melanie Crazier recommended obtaining MRI brain and orbits.  If the studies are negative patient will likely be safe to  follow-up in ophthalmology clinic early next week.  Care of patient signed over to Dr Delton Prairie at approximately 7pm with plan to f/u MR studies.         ____________________________________________   FINAL CLINICAL IMPRESSION(S) / ED DIAGNOSES  Final diagnoses:  Visual field scotoma of right eye  Paresthesia  Sinus congestion    Medications  gadobutrol (GADAVIST) 1 MMOL/ML injection 7 mL (7 mLs Intravenous Contrast Given 03/29/21 1922)     ED Discharge Orders     None        Note:  This document was prepared using Dragon voice recognition software and may include unintentional dictation errors.    Gilles Chiquito, MD 03/29/21 2015

## 2021-03-29 NOTE — ED Triage Notes (Signed)
Pt comes from [facility] with bilateral hand tingling and right eye vision changes. States a "triangle" in her vision field. Hand tingling started yesterday and vision changes started this morning.
